# Patient Record
Sex: Female | Born: 2000 | Race: Black or African American
Health system: Southern US, Community
[De-identification: ages and names within clinical notes are randomized; demographics above are authoritative.]

## PROBLEM LIST (undated history)

## (undated) DIAGNOSIS — F429 Obsessive-compulsive disorder, unspecified: Secondary | ICD-10-CM

## (undated) DIAGNOSIS — F330 Major depressive disorder, recurrent, mild: Secondary | ICD-10-CM

## (undated) DIAGNOSIS — F99 Mental disorder, not otherwise specified: Secondary | ICD-10-CM

## (undated) DIAGNOSIS — R42 Dizziness and giddiness: Secondary | ICD-10-CM

## (undated) DIAGNOSIS — G43909 Migraine, unspecified, not intractable, without status migrainosus: Secondary | ICD-10-CM

## (undated) DIAGNOSIS — F411 Generalized anxiety disorder: Secondary | ICD-10-CM

## (undated) HISTORY — DX: Obsessive-compulsive disorder, unspecified: F42.9

## (undated) HISTORY — DX: Major depressive disorder, recurrent, mild: F33.0

## (undated) HISTORY — DX: Mental disorder, not otherwise specified: F99

## (undated) HISTORY — DX: Generalized anxiety disorder: F41.1

## (undated) HISTORY — DX: Migraine, unspecified, not intractable, without status migrainosus: G43.909

## (undated) HISTORY — DX: Dizziness and giddiness: R42

---
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
## 2019-06-08 DIAGNOSIS — G43909 Migraine, unspecified, not intractable, without status migrainosus: Secondary | ICD-10-CM | POA: Insufficient documentation

## 2021-01-15 LAB — HEPATITIS B SURFACE ANTIBODY: HEPATITIS B SURFACE ANTIBODY: <3.31 m[IU]/mL

## 2021-01-18 LAB — QUANTIFERON(R)-TB GOLD PLUS
Mitogen-NIL: >10 IU/mL
NIL: 0.01 IU/mL
Quantiferon TB Gold Plus: NEGATIVE
TB1-NIL: 0 IU/mL
TB2-NIL: 0 IU/mL

## 2021-02-25 ENCOUNTER — Encounter (INDEPENDENT_AMBULATORY_CARE_PROVIDER_SITE_OTHER): Payer: Self-pay

## 2021-02-25 NOTE — Telephone Encounter (Signed)
Practice Opening Telephone Encounter on Self

## 2021-03-28 ENCOUNTER — Encounter (INDEPENDENT_AMBULATORY_CARE_PROVIDER_SITE_OTHER): Payer: Self-pay | Admitting: Obstetrics and Gynecology

## 2021-03-28 ENCOUNTER — Encounter (HOSPITAL_BASED_OUTPATIENT_CLINIC_OR_DEPARTMENT_OTHER): Payer: Self-pay

## 2021-03-28 NOTE — Progress Notes (Addendum)
OP Behavioral Health Prescreen (Psychiatry and Therapy)      Patients Name (Always update Registration for Patient): Peggy Bravo        Do you require Hard of Hearing Services? no        Reason for Referral: Why are you seeking services: Pt reports "I have had recent loss and grief. I have an racing heart beat, feeling over whelmed and easily overwhelmed. I have an grandparent who has been diagnosed with cancer. I witness the loss of someone.\"  (Therapy services or Psychiatry services -Please elaborate on reason for referral, Referral source)     Please know that our goal is to serve the needs of the community the best we can. At this time in outpatient psychiatry, we provide medication management services until short-term treatment goals are met. We partner with primary care to transfer stable patients to a primary care provider for continuation of medication management when appropriate.  We will evaluate your needs with you and as you stabilize, we will determine next steps together.   We are not able to provide assessments or short-term and  long-term treatment for ADHD.    Would like to continue scheduling for psychiatry services? N/A        Are you seeking treatment for any substance use? no  If Yes, Explain:   (Note: Recommend CATS services or REFER OUT IF APPROPRIATE)        Are you currently experiencing Suicidal Ideation with a plan or intent? no   If Yes, explain:  Note: REFER OUT IF APPROPRIATE      Are you currently experiencing Homicidal Ideation with a plan or intent? no   If Yes, explain:  Note: REFER OUT IF APPROPRIATE        Additional Comment:        Recommended Level of Care: Therapy      Patient scheduled for: [employer]: No      IMG/Pricare Referral: No        Reminders:   Psychiatry - If requesting specific medications: Note that prescription of any medication is based on the outcome of the providers assessment    Please bring a list of all prescription  medication to include dosages to your scheduled appointment    We ask that you give [place] at least 24-hour notice if you need to cancel or reschedule your appointment.    If you feel the need for urgent care, before your scheduled appointment, please seek emergency care immediately.      NOTE:   If a patient requires Landscape architect; Document in appointment desk that language services is needed   If a Pt is requesting non-clinical documentation (i.e. Disability evaluation), please refer to the following information:  Our mental health services are therapeutic in nature and assessment is limited to clinical evaluation and treatment.  Assessments that do not fall under [facility]'s scope of services include:  Court-ordered compliance   Evidence of disability   Court mandated services resulting in the provider testifying on your behalf  Expert witnesses in court cases  Impressions for or fitness for custody   Evaluations for emotional support animals

## 2021-04-03 ENCOUNTER — Other Ambulatory Visit (INDEPENDENT_AMBULATORY_CARE_PROVIDER_SITE_OTHER): Payer: Self-pay

## 2021-04-04 ENCOUNTER — Encounter (INDEPENDENT_AMBULATORY_CARE_PROVIDER_SITE_OTHER): Payer: Self-pay

## 2021-04-04 ENCOUNTER — Telehealth (INDEPENDENT_AMBULATORY_CARE_PROVIDER_SITE_OTHER): Payer: BC Managed Care – PPO | Admitting: Professional

## 2021-04-04 DIAGNOSIS — Z8489 Family history of other specified conditions: Secondary | ICD-10-CM

## 2021-04-04 DIAGNOSIS — F4321 Adjustment disorder with depressed mood: Secondary | ICD-10-CM

## 2021-04-04 DIAGNOSIS — F411 Generalized anxiety disorder: Secondary | ICD-10-CM

## 2021-04-04 DIAGNOSIS — F330 Major depressive disorder, recurrent, mild: Secondary | ICD-10-CM

## 2021-04-04 DIAGNOSIS — F29 Unspecified psychosis not due to a substance or known physiological condition: Secondary | ICD-10-CM

## 2021-04-04 DIAGNOSIS — Z8659 Personal history of other mental and behavioral disorders: Secondary | ICD-10-CM
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
## 2021-04-18 ENCOUNTER — Telehealth (INDEPENDENT_AMBULATORY_CARE_PROVIDER_SITE_OTHER): Payer: BC Managed Care – PPO | Admitting: Professional

## 2021-04-18 DIAGNOSIS — F4321 Adjustment disorder with depressed mood: Secondary | ICD-10-CM

## 2021-04-18 DIAGNOSIS — F330 Major depressive disorder, recurrent, mild: Secondary | ICD-10-CM

## 2021-04-18 DIAGNOSIS — F411 Generalized anxiety disorder: Secondary | ICD-10-CM

## 2021-04-18 NOTE — Progress Notes (Unsigned)
[facility] Outpatient  Individual Therapy Note    Name:  Peggy Johnson   DOB: [birth year]  Medical Records: [id number]    Time in: 8:30 am   Time out:  9:15 am    Duration of Session:  45 minutes    Phone Call Visits Only: N/A    Goal(s) Addressed: Identification of cognitive distortions, Increased mindfulness, Positive behavioral changes Anxiety: eliminating all anxiety symptoms (SCL-90-R scores in normal range), reducing negative automatic thoughts, and reducing time spent worrying (<30 minutes/day)  Depression: eliminating all depressive symptoms (BDI score <10 for 1 month), increasing self-reward for positive behaviors (one/day), increasing self-reward for positive thoughts (one/day), and reducing negative automatic thoughts    Intervention: Cognitive-Behavioral Therapy strategies were implemented.  Interpersonal struggles in relationship/marriage were addressed with guidance.  Interpersonal, solution focused and support therapy strategies were implemented.     Verbal consent has been obtained from the patient to conduct video visit to minimize exposure to COVID-19. During COVID-19 pandemic, requirement for written consent has been waived.       NARRATIVE:     Patient's presenting problem: She continues to meet criteria for the [facility] DSM5 DIAGNOSES:32958} diagnoses. Patient stated, *** She continues to experience symptoms of: {.:21023014} and appears to be {Desc; clinical condition:2101701}.     Patient reported {he/she/they:37405} is experiencing *** and feelings of *** following recent ***. She noted {Desc; increased/decreased/absent/diminished:30183} psychosocial stressors including studying for the  MCAT.     Patient reported she does not take medications     ASSESSMENT:     Patient Health Questionnaire-9: PHQ-9 (Results):    Little interest or pleasure in doing things: (P) Not at all  Feeling down, depressed, or hopeless: (P) Several days  Trouble falling or staying asleep, or sleeping too  much: (P) Not at all  Feeling tired or having little energy: (P) Several days  Poor appetite or overeating: (P) Not at all  Feeling bad about yourself - or that you are a failure or have let yourself or your family down: (P) Not at all  Trouble concentrating on things, such as reading the newspaper or watching television: (P) More than half the days  Moving or speaking so slowly that other people could have noticed. Or the opposite - being so fidgety or restless that you have been moving around a lot more than usual: (P) More than half the days  Thoughts that you would be better off dead, or of hurting yourself in some way: (P) Not at all  PHQ Total Score: (P) 6    Current Symptoms:  Depressed mood    Feeling down, depressed, or hopeless     Feeling down due to physically not feeling well stressed   Mental Status Exam:    General Appearance: {BHGeneral/Appearance:46759::"neatly groomed","adequately nourished","casually dressed"}  Behavior/Psychomotor: normal  and good eye contact  Mood: depressed  and sad  Affect: congruent with mood and tearful  Speech: normal pitch and normal volume  Language: verbal expression is clear , has comprehensible ideas through verbal articulation, and ideas are conveyed and properly produced  Thought Form/Process: well organized, associations intact, and goal directed  Thought Content: absent of psychotic symptoms and absent of suicidal or homicidal ideation  Perception /Sensorium: clear, intact and makes sense of stimuli received  Judgment: fair  Insight: fair  Cognitive Functioning: alert, oriented to person, oriented to place, oriented to time, oriented to situation, memory intact, and attentive    Safety Risks:    Suicidal ideation? No  Homicidal ideation? No   Thoughts of harm to others? No   Thoughts of self injury? No   Substance abuse cravings? No     Risk factors include: {BHRisk Factors:46909}  Protective factors include: Identifies reasons for living Belief that suicide is  immoral; high spirituality, Responsibility to family or others; living with family, Supportive social network of family or friends, and Other: My dogs  Forward thinking:Yes     Patient was found to be at no significant risk for danger to self or danger to others and is safe for treatment in the outpatient level of care. Yes    Response to intervention/skills and/or knowledge gained: Rosalba shared about   Giref uring session and appeared {DESC; OPEN/CLOSED:18543} to feedback and     Journaling,writing, limited free time, exercise and spending time    intervention ***.        TREATMENT PLAN:     Patient Stated Goals Addressed: ***     [] Psychotherapy 1x weekly      [] Psychotherapy 2x monthly     {Psychotherapy Interventions:46315}    She is learning about the therapeutic interventions evidenced by:***.    Progress Towards Goals:    {MMPROGRESS:46183::"Stable/Improved; at goal"}      PLAN:     Follow up appointment: Date and Time:***  Patient aware to call 911 or ER in case of psychiatric emergency.  Continue psychotherapy with the patient and to emphasize the importance of implementing psychotherapeutic techniques at home and work.     Susann Givens, LPC

## 2021-04-29 ENCOUNTER — Encounter (INDEPENDENT_AMBULATORY_CARE_PROVIDER_SITE_OTHER): Payer: Self-pay | Admitting: Family

## 2021-04-29 ENCOUNTER — Ambulatory Visit (INDEPENDENT_AMBULATORY_CARE_PROVIDER_SITE_OTHER): Payer: BC Managed Care – PPO | Admitting: Family

## 2021-04-29 VITALS — BP 118/70 | HR 93 | Temp 97.0°F | Ht 64.0 in | Wt 215.8 lb

## 2021-04-29 DIAGNOSIS — Z13228 Encounter for screening for other metabolic disorders: Secondary | ICD-10-CM

## 2021-04-29 DIAGNOSIS — Z130 Encounter for screening for diseases of the blood and blood-forming organs and certain disorders involving the immune mechanism: Secondary | ICD-10-CM

## 2021-04-29 DIAGNOSIS — Z13220 Encounter for screening for lipoid disorders: Secondary | ICD-10-CM

## 2021-04-29 DIAGNOSIS — Z0000 Encounter for general adult medical examination without abnormal findings: Secondary | ICD-10-CM

## 2021-04-29 LAB — CBC AND DIFFERENTIAL
Absolute NRBC: 0 10*3/uL (ref 0.00–0.00)
Basophils Absolute Automated: 0.05 10*3/uL (ref 0.00–0.08)
Basophils Automated: 0.6 %
Eosinophils Absolute Automated: 0.22 10*3/uL (ref 0.00–0.44)
Eosinophils Automated: 2.7 %
Hematocrit: 37 % (ref 34.7–43.7)
Hgb: 11.3 g/dL — ABNORMAL LOW (ref 11.4–14.8)
Immature Granulocytes Absolute: 0.02 10*3/uL (ref 0.00–0.07)
Immature Granulocytes: 0.2 %
Lymphocytes Absolute Automated: 1.69 10*3/uL (ref 0.42–3.22)
Lymphocytes Automated: 21.1 %
MCH: 25.3 pg (ref 25.1–33.5)
MCHC: 30.5 g/dL — ABNORMAL LOW (ref 31.5–35.8)
MCV: 83 fL (ref 78.0–96.0)
MPV: 12.8 fL — ABNORMAL HIGH (ref 8.9–12.5)
Monocytes Absolute Automated: 0.56 10*3/uL (ref 0.21–0.85)
Monocytes: 7 %
Neutrophils Absolute: 5.48 10*3/uL (ref 1.10–6.33)
Neutrophils: 68.4 %
Nucleated RBC: 0 /100 WBC (ref 0.0–0.0)
Platelets: 301 10*3/uL (ref 142–346)
RBC: 4.46 10*6/uL (ref 3.90–5.10)
RDW: 14 % (ref 11–15)
WBC: 8.02 10*3/uL (ref 3.10–9.50)

## 2021-04-29 LAB — COMPREHENSIVE METABOLIC PANEL
ALT: 18 U/L (ref 0–55)
AST (SGOT): 17 U/L (ref 5–41)
Albumin/Globulin Ratio: 1.1 (ref 0.9–2.2)
Albumin: 3.6 g/dL (ref 3.5–5.0)
Alkaline Phosphatase: 73 U/L (ref 37–117)
Anion Gap: 9 (ref 5.0–15.0)
BUN: 8 mg/dL (ref 7.0–21.0)
Bilirubin, Total: 0.4 mg/dL (ref 0.2–1.2)
CO2: 24 mEq/L (ref 17–29)
Calcium: 9.5 mg/dL (ref 8.5–10.5)
Chloride: 103 mEq/L (ref 99–111)
Creatinine: 0.9 mg/dL (ref 0.4–1.0)
Globulin: 3.2 g/dL (ref 2.0–3.6)
Glucose: 79 mg/dL (ref 70–100)
Potassium: 4.7 mEq/L (ref 3.5–5.3)
Protein, Total: 6.8 g/dL (ref 6.0–8.3)
Sodium: 136 mEq/L (ref 135–145)

## 2021-04-29 LAB — LIPID PANEL
Cholesterol / HDL Ratio: 3.7 Index
Cholesterol: 183 mg/dL (ref 0–199)
HDL: 50 mg/dL (ref 40–9999)
LDL Calculated: 93 mg/dL (ref 0–99)
Triglycerides: 202 mg/dL — ABNORMAL HIGH (ref 34–149)
VLDL Calculated: 40 mg/dL (ref 10–40)

## 2021-04-29 LAB — HEMOLYSIS INDEX: Hemolysis Index: 4 Index (ref 0–24)

## 2021-04-29 LAB — GFR: EGFR: >60

## 2021-04-29 NOTE — Progress Notes (Signed)
Date: 04/29/2021 8:35 AM   Patient ID: Peggy Johnson is a 20 y.o. female.         Have you seen any specialists since your last visit with [place]?  Yes      The patient was informed that the following HM items are still COVID  Booster    Subjective:      Chief Complaint:  No chief complaint on file.      HPI:  Visit Type: Health Maintenance Visit  Work Status: working full-time  Reported Health: good health  Reported Diet: less fried foods  Reported Exercise: very active at work  Dental: dentist visit > 1 year ago  Vision: glasses and contact lenses  Hearing: normal hearing  Immunization Status: immunizations up to date  Reproductive Health: sexually active  Prior Screening Tests: no previous colorectal cancer screening and no previous pap smear  General Health Risks: no family history of prostate cancer, no family history of colon cancer, and no family history of breast cancer  Safety Elements Used: uses seat belts, smoke detectors in household, carbon monoxide detectors in household, and sunscreen use    Peggy Johnson is seen for fasting labs, CPE. 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 She has frequent migraines, which tend to improve with Excedrin Migraine. She has had a couple episodes of Vertigo, has not taken medication. Symptoms were self-limited.     Problem List:  There is no problem list on file for this patient.      Current Medications:  Outpatient Medications Marked as Taking for the 04/29/21 encounter (Office Visit) with Zetta Bills, FNP   Medication Sig Dispense Refill    aspirin-acetaminophen-caffeine (Excedrin Migraine) 250-250-65 MG per tablet       ibuprofen (ADVIL) 100 MG tablet       norgestimate-ethinyl estradiol triphasic 0.18/0.215/0.25 MG-25 MCG Tab oral tablet Take 1 tablet by mouth daily          Allergies:  Allergies   Allergen Reactions    Penicillins Hives and Itching       Past Medical History:  History reviewed. No pertinent past medical  history.    Past Surgical History:  History reviewed. No pertinent surgical history.    Family History:  History reviewed. No pertinent family history.    Social History:  Social History     Tobacco Use    Smoking status: Never    Smokeless tobacco: Never   Vaping Use    Vaping Use: Never used   Substance Use Topics    Alcohol use: Yes     Alcohol/week: 2.0 standard drinks     Types: 2 Standard drinks or equivalent per week    Drug use: Never        The following sections were reviewed this encounter by the provider:        Vitals:  BP 131/81 (BP Site: Left arm, Patient Position: Sitting, Cuff Size: Large)   Pulse 93   Temp 97 F (36.1 C)   Ht 1.626 m (5\' 4" )   Wt 97.9 kg (215 lb 12.8 oz)   LMP 04/11/2021   SpO2 98%   BMI 37.04 kg/m       Wt Readings from Last 3 Encounters:   04/29/21 97.9 kg (215 lb 12.8 oz)     ROS:   Review of Systems   General/Constitutional:   Denies Change in appetite. Denies Chills. Denies Fatigue. Denies Fever.   Ophthalmologic:  Denies Eye Pain.   ENT:   Denies Nasal Discharge. Denies Nosebleed.Denies Sore throat.   Respiratory:   Denies Cough. Denies Orthopnea. Denies Shortness of breath. Denies Wheezing.   Cardiovascular:   Denies Chest pain. Denies Chest pain with exertion. Denies Leg Claudication. Denies Palpitations. Denies Swelling in hands/feet.   Gastrointestinal:   Denies Abdominal pain. Denies Blood in stool. Denies Constipation. Denies Diarrhea. Denies Heartburn. Denies Nausea. Denies Vomiting.   Genitourinary:   Denies Blood in urine. Denies Nocturia. Denies Painful urination.   Musculoskeletal:   Denies Joint pain. Denies Joint stiffness. Denies Leg cramps. Denies Muscle aches. Denies Weakness in LE. Denies Swollen joints. Denies Weakness in UE.   Skin:   Denies Rash.   Neurologic:   Denies Balance difficulty. Denies Dizziness. Denies Gait abnormality. Denies Headache. Denies Pre-Syncope. Denies Memory loss. Denies Seizures. Denies Tingling/Numbness.   Psychiatric:    Denies Anxiety. Denies Depressed mood. Denies Difficulty sleeping.     Objective:     Examination:   Physical Exam   GENERAL APPEARANCE: alert, in no acute distress, well developed, well nourished, oriented to time, place, and person.   HEAD: normal appearance, atraumatic.   EYES: sclera anicteric, conjunctiva clear.   EARS: tympanic membranes normal bilaterally, external canals normal .   NOSE: normal nasal mucosa, no lesions.   ORAL CAVITY: normal oropharynx, normal lips, mucosa moist, no lesions.   THROAT: normal appearance, clear, no erythema.   NECK/THYROID: neck supple, no carotid bruit, carotid pulse 2+ bilaterally, no cervical lymphadenopathy, no neck mass palpated, no jugular venous distention, no thyromegaly.   LYMPH NODES: no palpable adenopathy.   SKIN: good turgor, no rashes, no suspicious lesions.   HEART: S1, S2 normal, no murmurs, rubs, gallops, regular rate and rhythm.   LUNGS: normal effort / no distress, normal breath sounds, clear to auscultation bilaterally, no wheezes, rales, rhonchi.   BREASTS: Not examined.  Deferred to GYN  ABDOMEN: bowel sounds present, no hepatosplenomegaly, soft, nontender, nondistended.   RECTAL: Not examined.  Deferred to GYN  FEMALE GENITOURINARY: Not examined.  Deferred to GYN  MUSCULOSKELETAL: full range of motion, no swelling or deformity.   EXTREMITIES: no clubbing, cyanosis, or edema.   PERIPHERAL PULSES: 2+ dorsalis pedis, 2+ posterior tibial.   NEUROLOGIC: deep tendon reflexes 2+ symmetrical, normal strength, tone and reflexes, sensory exam intact.   PSYCH: cognitive function intact, mood/affect full range, speech clear.        Assessment:     1. Routine medical exam    2. Screening for deficiency anemia  - CBC and differential    3. Screening for metabolic disorder  - Comprehensive metabolic panel    4. Screening, lipid  - Lipid panel        Plan:   Health Maintenance:   Recommend optimizing low carbohydrate diet efforts and obtaining at least 150 minutes of  aerobic exercise per week. Recommend 20-25 grams of dietary fiber daily. Recommend drinking at least 60-80 ounces of water per day. Recommend optimizing low sodium diet measures ( less than 2 grams of sodium in the diet per day ).Immunizations UTD. Vision screening UTD. Dental Screening UTD. Gynecology surveillance is due.  Recommend follow-up with gynecology at earliest convenience.    Discussed healthy low fat diet, regular aerobic exercise    Follow-up:   Return if symptoms worsen or fail to improve.       Zetta Bills, FNP

## 2021-05-01 ENCOUNTER — Telehealth (INDEPENDENT_AMBULATORY_CARE_PROVIDER_SITE_OTHER): Payer: BC Managed Care – PPO | Admitting: Professional

## 2021-05-10 ENCOUNTER — Encounter (INDEPENDENT_AMBULATORY_CARE_PROVIDER_SITE_OTHER): Payer: Self-pay | Admitting: Obstetrics and Gynecology

## 2021-05-10 ENCOUNTER — Ambulatory Visit (INDEPENDENT_AMBULATORY_CARE_PROVIDER_SITE_OTHER): Payer: BC Managed Care – PPO | Admitting: Obstetrics and Gynecology

## 2021-05-10 VITALS — BP 134/80 | HR 93 | Temp 97.2°F | Ht 64.0 in | Wt 216.0 lb

## 2021-05-10 DIAGNOSIS — Z01419 Encounter for gynecological examination (general) (routine) without abnormal findings: Secondary | ICD-10-CM

## 2021-05-10 DIAGNOSIS — Z113 Encounter for screening for infections with a predominantly sexual mode of transmission: Secondary | ICD-10-CM

## 2021-05-10 DIAGNOSIS — R42 Dizziness and giddiness: Secondary | ICD-10-CM | POA: Insufficient documentation

## 2021-05-10 DIAGNOSIS — Z760 Encounter for issue of repeat prescription: Secondary | ICD-10-CM

## 2021-05-10 LAB — HEPATITIS PANEL, ACUTE
Hep A IgM: NONREACTIVE
Hepatitis B Core IgM: NONREACTIVE
Hepatitis B Surface Antigen: NONREACTIVE
Hepatitis C, AB: NONREACTIVE

## 2021-05-10 LAB — SYPHILIS SCREEN IGG AND IGM: Syphilis Screen IgG and IgM: NONREACTIVE

## 2021-05-10 LAB — HIV-1/2 AG/AB 4TH GEN. W/ REFLEX: HIV Ag/Ab, 4th Generation: NONREACTIVE

## 2021-05-10 MED ORDER — NORGESTIM-ETH ESTRAD TRIPHASIC 0.18/0.215/0.25 MG-25 MCG PO TABS
1.0000 | ORAL_TABLET | Freq: Every day | ORAL | 3 refills | Status: DC
Start: 2021-05-10 — End: 2022-07-29

## 2021-05-10 NOTE — Progress Notes (Signed)
ANNUAL GYN VISIT     20 y.o. G0P0000 who presents for annual exam.  Anticipates initial pap smear with today's exam.    Chief Complaint   Patient presents with    Gynecologic Exam       She is doing well today without complaints.    Denies bladder/bowel/breast changes.    - Menses: monthly/OCP induced/ heavy flow; reports cramps, HA, nausea  - Sexual activity: yes:  more than one partner   using condoms regularly  - Contraception: Ortho tricyclen x 60yrs  - Paps:   abnormal history: no  Last pap: no prior pap smear   Procedures:   - STIs: denies  - Menopause/HRT: n/a  - HPV vaccine yes    ROS: Complete review of systems negative except as indicated above in HPI.    OB:  OB History   Gravida Para Term Preterm AB Living   0 0 0 0 0 0   SAB IAB Ectopic Multiple Live Births   0 0 0 0 0        PMH:  has a past medical history of GAD (generalized anxiety disorder), MDD (major depressive disorder), recurrent episode, Mental disorder, and OCD (obsessive compulsive disorder).   Surgical Hx:   has no past surgical history on file.  FH Hx: family history includes Cancer in her paternal grandfather; Diabetes in her paternal grandmother; Hypertension in her father; Kidney failure in her paternal grandmother; Stroke in her maternal grandmother and paternal grandfather.  Social:   reports that she has never smoked. She has never used smokeless tobacco. She reports current alcohol use of about 2.0 standard drinks per week. She reports that she does not use drugs.       Physical Exam   BP 134/80   Pulse 93   Temp 97.2 F (36.2 C) (Temporal)   Ht 5\' 4"  (1.626 m)   Wt 216 lb (98 kg)   LMP 04/11/2021   BMI 37.08 kg/m   GENERAL: NAD, pt is well-appearing and her stated age  PSYCHIATRIC: AAOx3, mood and affect appropriate   HEENT: normocephalic, atraumatic  CARDIAC: Normal pulses   PULMONARY: Normal respiratory effort  BREAST: Normal symmetry bilaterally. No skin dimpling, retraction or discoloration. No palpable masses or  adenopathy. No nipple discharge.  ABDOMEN: Soft, nontender, nondistended.  No appreciable masses.  EXTREMITY: no edema.  MUSCULOSKELETAL: normal range of motion  NEURO: no focal motor deficits   SKIN/INTEGUMENT: no rashes or bruising    PELVIC:   Normal external genitalia without any lesions or skin changes  Normal vaginal mucosa without evidence of atrophy; no discharge  Normal appearing cervix without lesions; no cervical motion tenderness  Uterus is anteverted, normal in size/contour, mobile and nontender  No adnexal fullness or tenderness      Assessment and Plan   20 y.o. G0P0000 who presents for annual exam.    1. Health Maintenance:  - Pap: initial pap smear to lab  - STI screening: Testing opted:  serum panel and genital probe to lab  - Mammogram: not indicated     2. Contraception: OCP continues- Ortho Tricyclen; refill Rx desired/sent via [employer]    Encouraged daily multivitamin with vitamin D supplementation.   Reviewed SBE instructions and encouraged monthly.   Healthy diet and exercise encouraged.     Lucendia Herrlich, MD  Date: 05/10/2021  Time: 11:47 AM

## 2021-05-11 LAB — HSV TYPE 1 AND 2 IGG
Herpes Antibody Type 1 IgG: NEGATIVE
Herpes Antibody Type 2 IgG: NEGATIVE

## 2021-05-13 LAB — GENITAL - CHLAMYDIA/NEISSERIA/TRICH PCR
Chlamydia DNA by PCR: NEGATIVE
Neisseria gonorrhoeae by PCR: NEGATIVE
Trichomonas vaginalis, DNA: NEGATIVE

## 2021-05-20 LAB — PAP SMEAR, THIN PREP WITH HR HPV: HPV DNA, high risk: NOT DETECTED

## 2021-07-18 ENCOUNTER — Telehealth (INDEPENDENT_AMBULATORY_CARE_PROVIDER_SITE_OTHER): Payer: Self-pay | Admitting: Adult Health

## 2021-07-18 NOTE — Telephone Encounter (Signed)
Patient requests immunization records. Printed and given to patient.

## 2021-08-21 ENCOUNTER — Encounter (INDEPENDENT_AMBULATORY_CARE_PROVIDER_SITE_OTHER): Payer: Self-pay | Admitting: Adult Health

## 2021-08-21 ENCOUNTER — Ambulatory Visit (INDEPENDENT_AMBULATORY_CARE_PROVIDER_SITE_OTHER): Payer: BC Managed Care – PPO | Admitting: Adult Health

## 2021-08-21 VITALS — BP 106/72 | HR 102 | Temp 97.7°F | Ht 64.0 in | Wt 219.2 lb

## 2021-08-21 DIAGNOSIS — Z3009 Encounter for other general counseling and advice on contraception: Secondary | ICD-10-CM
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
## 2021-09-19 ENCOUNTER — Other Ambulatory Visit (INDEPENDENT_AMBULATORY_CARE_PROVIDER_SITE_OTHER): Payer: Self-pay | Admitting: Adult Health

## 2021-09-19 MED ORDER — DIAZEPAM 5 MG PO TABS
5.0000 mg | ORAL_TABLET | Freq: Once | ORAL | 0 refills | Status: AC
Start: 2021-09-19 — End: 2021-09-19

## 2021-09-24 ENCOUNTER — Ambulatory Visit (INDEPENDENT_AMBULATORY_CARE_PROVIDER_SITE_OTHER): Payer: BC Managed Care – PPO | Admitting: Family

## 2021-09-24 ENCOUNTER — Encounter (INDEPENDENT_AMBULATORY_CARE_PROVIDER_SITE_OTHER): Payer: Self-pay | Admitting: Family

## 2021-09-24 VITALS — BP 128/88 | HR 96 | Temp 97.4°F | Resp 16 | Ht 64.0 in | Wt 219.0 lb

## 2021-09-24 DIAGNOSIS — J209 Acute bronchitis, unspecified: Secondary | ICD-10-CM

## 2021-09-24 DIAGNOSIS — R509 Fever, unspecified: Secondary | ICD-10-CM

## 2021-09-24 DIAGNOSIS — J029 Acute pharyngitis, unspecified: Secondary | ICD-10-CM

## 2021-09-24 LAB — IHS AMB POCT SOFIA (TM) COVID-19 & FLU A/B
Sofia Influenza A Ag POCT: NEGATIVE
Sofia Influenza B Ag POCT: NEGATIVE
Sofia SARS COV2 Antigen POCT: NEGATIVE

## 2021-09-24 LAB — POCT RAPID STREP A: Rapid Strep A Screen POCT: NEGATIVE

## 2021-09-24 MED ORDER — AZITHROMYCIN 250 MG PO TABS
ORAL_TABLET | ORAL | 0 refills | Status: AC
Start: 2021-09-24 — End: 2021-09-28

## 2021-09-24 NOTE — Progress Notes (Unsigned)
Fl Have you seen any specialists since your last visit with [place]?  No      The patient was informed that the following HM items are still outstanding:   Hpv vaccine, boosters.

## 2021-09-24 NOTE — Progress Notes (Unsigned)
Subjective:      Date: 09/24/2021 1:41 PM   Patient ID: Peggy Johnson is a 21 y.o. female.    Chief Complaint:  Chief Complaint   Patient presents with    Fever     X3 days    Generalized Body Aches    Sore Throat       HPI:  HPI    Problem List:  Patient Active Problem List   Diagnosis    Migraines    Vertigo       Current Medications:  Outpatient Medications Marked as Taking for the 09/24/21 encounter (Office Visit) with Zetta Bills, FNP   Medication Sig Dispense Refill    aspirin-acetaminophen-caffeine (Excedrin Migraine) 250-250-65 MG per tablet       Cholecalciferol (VITAMIN D3 PO)       Cyanocobalamin (VITAMIN B 12 PO)       ibuprofen (ADVIL) 100 MG tablet       norgestimate-ethinyl estradiol triphasic 0.18/0.215/0.25 MG-25 MCG Tab oral tablet Take 1 tablet by mouth daily 84 tablet 3    ST JOHNS WORT PO       UNABLE TO FIND Ashwagandha root           Allergies:  Allergies   Allergen Reactions    Penicillins Hives and Itching       Past Medical History:  Past Medical History:   Diagnosis Date    GAD (generalized anxiety disorder)     MDD (major depressive disorder), recurrent episode     Mental disorder     OCD (obsessive compulsive disorder)        Past Surgical History:  No past surgical history on file.    Family History:  Family History   Problem Relation Age of Onset    Cancer Paternal Grandfather     Stroke Paternal Grandfather     Kidney failure Paternal Grandmother     Diabetes Paternal Grandmother     Stroke Maternal Grandmother     Hypertension Father        Social History:  Social History     Tobacco Use    Smoking status: Never    Smokeless tobacco: Never   Vaping Use    Vaping status: Never Used   Substance Use Topics    Alcohol use: Yes     Alcohol/week: 2.0 standard drinks of alcohol     Types: 2 Glasses of wine per week    Drug use: Never          The following sections were reviewed this encounter by the provider:        Vitals:  BP 128/88 (BP Site: Right arm, Patient Position:  Sitting, Cuff Size: Large)   Pulse 96   Temp 97.4 F (36.3 C) (Temporal)   Resp 16   Ht 1.626 m (5\' 4" )   Wt 99.3 kg (219 lb)   LMP 09/05/2021 (Exact Date)   SpO2 98%   BMI 37.59 kg/m     ROS:  Review of Systems   General/Constitutional:   Denies Chills. Denies Fatigue. Denies Fever.   ENT:   Denies Nasal Discharge. Denies Sinus pain. Denies Sore throat.   Respiratory:   Admits Cough. Denies Shortness of breath. Denies Wheezing.   Cardiovascular:   Denies Chest pain. Denies Chest pain with exertion. Denies Palpitations.   Gastrointestinal:   Denies Nausea. Denies Vomiting.   Neurologic:   Denies Dizziness. Denies Headache.        Objective:  Physical Exam:  Physical Exam   General Examination:   GENERAL APPEARANCE: alert, in no acute distress, well developed, well nourished  EYES: Conjunctiva clear B/L  EARS:  TM intact B/L no erythema EAC's intact B/L  SINUS: No sinus tenderness  ORAL CAVITY: normal oropharynx, normal lips, mucosa moist.   THROAT: clear PND   NECK/THYROID: neck supple,  no cervical  lymphadenopathy, no neck mass palpated  HEART: S1, S2 normal, no murmurs, rubs, gallops, regular rate and rhythm.   LUNGS: normal effort / no distress, normal breath sounds, CTA B/L, no wheezes, no rales, no rhonchi  NEUROLOGIC: alert and oriented.      Assessment:       1. Fever and chills  - Sofia(TM) SARS COVID19 & Flu A/B POCT  - COVID-19 (SARS-CoV-2); Future    2. Sorethroat  - POCT Rapid Group A Strep        Plan:   URI:  Status: {uristatus:36940}  Treatment recommendations: {URIrecommendations:36941}{URItxabx:36942}    No follow-ups on file.    Zetta Bills, FNP

## 2021-09-25 LAB — COVID-19 (SARS-COV-2): SARS CoV 2 Overall Result: NOT DETECTED

## 2021-10-02 ENCOUNTER — Encounter (INDEPENDENT_AMBULATORY_CARE_PROVIDER_SITE_OTHER): Payer: Self-pay | Admitting: Adult Health

## 2021-10-02 ENCOUNTER — Ambulatory Visit (INDEPENDENT_AMBULATORY_CARE_PROVIDER_SITE_OTHER): Payer: BC Managed Care – PPO | Admitting: Adult Health

## 2021-10-02 VITALS — BP 104/72 | HR 83 | Temp 97.8°F | Resp 16 | Ht 64.8 in | Wt 218.0 lb

## 2021-10-02 DIAGNOSIS — Z3202 Encounter for pregnancy test, result negative: Secondary | ICD-10-CM

## 2021-10-02 DIAGNOSIS — Z30430 Encounter for insertion of intrauterine contraceptive device: Secondary | ICD-10-CM

## 2021-10-02 LAB — POCT PREGNANCY TEST, URINE HCG: POCT Pregnancy HCG Test, UR: NEGATIVE

## 2021-10-02 MED ORDER — LEVONORGESTREL 20 MCG/DAY IU IUD
1.0000 | INTRAUTERINE_SYSTEM | Freq: Once | INTRAUTERINE | Status: AC
Start: 2021-10-02 — End: 2021-10-02
  Administered 2021-10-02: 52 mg via INTRAUTERINE

## 2021-10-02 NOTE — Progress Notes (Unsigned)
UNIVERSAL PROTOCOL:  TIMEOUT    Procedure: Mirena IUD Placement Date: 10/02/2021   All correct equipment/supplies are present  And ready for use prior to the procedure YES   Patient stated name and date of birth YES   Patient verbally stated the procedure (including the site and side) to be completed. YES   Informed Consent reviewed and signed consistent with procedure, side, and site information YES   Practitioner (MD/DO/DPM/NP/PA/RN) performing the procedure marked the site as indicated YES   Asked the patient for any known drug allergies, including anesthetics and latex. YES   Labels for specimens are prepared with the following information:  Date specimen collected  Name of patient  MR#  Provider name  Specimen type  and placed on containers in presence of patient N/A   Verified that patient has had all questions answered. YES   Method of notification of biopsy results requested by patient      UPT: Negative N/A           INSTRUMENTS USED DURING PROCEDURE:     Tenaculum sterilized on 09/26/2021 in load 1    scissors sterilized on 09/26/2021 in load 2    0 sterilized on 0 in load 0

## 2021-10-02 NOTE — Progress Notes (Unsigned)
Procedures      Peggy Johnson 21 y.o. G0P0000 presents for Mirena IUD insertion       Procedure: Mirena IUD insertion  Date: 10/02/2021   LMP: Patient's last menstrual period was 09/29/2021 (exact date).  Contraception: OCP  UPT: negative    BP 104/72 (BP Site: Left arm, Patient Position: Sitting, Cuff Size: Large)   Pulse 83   Temp 97.8 F (36.6 C) (Temporal)   Resp 16   Ht 5' 4.8" (1.646 m)   Wt 218 lb (98.9 kg)   LMP 09/29/2021 (Exact Date)   SpO2 99%   BMI 36.50 kg/m      Prior to the procedure, the patient provided informed written consent via iMED. A bimanual examination was performed and revealed an anteverted uterus. A speculum was inserted into the vagina and the cervix was visualized. The cervix was cleansed with betadine solution. A single tooth tenaculum was placed on the anterior lip of the cervix. The uterus was sounded to 7.5 cm. The Mirena IUD insertion device was inserted through the os to the fundus and the IUD was deployed per the manufacturer's instructions without difficulty. The IUD strings were trimmed to 3cm. The single tooth tenaculum was removed and the sites determined to be hemostatic to gentle pressure. Monsel's solution was utilized at each tenaculum site to ensure hemostasis. The speculum was removed from the vagina. The patient tolerated the procedure well.     - Advised continue to use OCP for back up and to help with bleeding for 4 weeks or switch to condoms.   - Follow-up in 6 wks for string check. Sooner for issues.  - Reviewed bleeding precautions. Advised ibuprofen 600mg  for pain     Azadeh Hyder A Valeri Sula, NP

## 2021-10-04 ENCOUNTER — Ambulatory Visit (INDEPENDENT_AMBULATORY_CARE_PROVIDER_SITE_OTHER): Payer: BC Managed Care – PPO | Admitting: Adult Health

## 2021-10-16 ENCOUNTER — Ambulatory Visit
Admission: RE | Admit: 2021-10-16 | Discharge: 2021-10-16 | Disposition: A | Discharge status: Home / Self Care | Payer: BC Managed Care – PPO | Source: Ambulatory Visit | Attending: Family | Admitting: Family

## 2021-10-16 ENCOUNTER — Encounter (INDEPENDENT_AMBULATORY_CARE_PROVIDER_SITE_OTHER): Payer: Self-pay | Admitting: Family

## 2021-10-16 ENCOUNTER — Ambulatory Visit (INDEPENDENT_AMBULATORY_CARE_PROVIDER_SITE_OTHER): Payer: BC Managed Care – PPO | Admitting: Family

## 2021-10-16 VITALS — BP 137/78 | HR 88 | Temp 98.1°F | Resp 18 | Ht 64.0 in | Wt 219.0 lb

## 2021-10-16 DIAGNOSIS — S99921A Unspecified injury of right foot, initial encounter: Secondary | ICD-10-CM | POA: Insufficient documentation

## 2021-10-16 MED ORDER — NAPROXEN 500 MG PO TABS
500.0000 mg | ORAL_TABLET | Freq: Two times a day (BID) | ORAL | 0 refills | Status: AC | PRN
Start: 2021-10-16 — End: 2021-11-05

## 2021-10-16 NOTE — Progress Notes (Signed)
[facility]                       Date of Exam: 10/16/2021 7:53 PM        Patient ID: Peggy Johnson is a 21 y.o. Johnson.  Attending Physician: Amadeo GarnetIfeoma Elizabeth Emmalise Huard, Peggy Johnson        Chief Complaint:    Chief Complaint   Patient presents with    Foot Swelling     Pt dropped 45lb weight on r-foot on 5/17  Pt reports 6/10               HPI:    Patient patient is a 21 year old Johnson who presents today with complaints of right foot injury.  She reports incident occurred yesterday at the gym.  She had dropped a 45 pound weights on the dorsal aspect of right foot.  Foot appears swollen and tender to touch  Patient has been applying ice to the area  Reports mild difficulty with walking.          Problem List:    Patient Active Problem List   Diagnosis    Migraines    Vertigo             Current Meds:    Outpatient Medications Marked as Taking for the 10/16/21 encounter (Office Visit) with Amadeo GarnetIlodianya, Arlo Buffone Elizabeth, Peggy Johnson   Medication Sig Dispense Refill    aspirin-acetaminophen-caffeine (Excedrin Migraine) 250-250-65 MG per tablet       Cholecalciferol (VITAMIN D3 PO)       Cyanocobalamin (VITAMIN B 12 PO)       norgestimate-ethinyl estradiol triphasic 0.18/0.215/0.25 MG-25 MCG Tab oral tablet Take 1 tablet by mouth daily 84 tablet 3    ST JOHNS WORT PO       UNABLE TO FIND Ashwagandha root      [DISCONTINUED] ibuprofen (ADVIL) 100 MG tablet             Allergies:    Allergies   Allergen Reactions    Penicillins Hives and Itching             Past Surgical History:    History reviewed. No pertinent surgical history.        Family History:    Family History   Problem Relation Age of Onset    Cancer Paternal Grandfather     Stroke Paternal Grandfather     Kidney failure Paternal Grandmother     Diabetes Paternal Grandmother     Stroke Maternal Grandmother     Hypertension Father     Miscarriages / [place] Mother            Social History:    Social History     Tobacco Use     Smoking status: Never    Smokeless tobacco: Never   Vaping Use    Vaping status: Never Used   Substance Use Topics    Alcohol use: Yes     Alcohol/week: 2.0 standard drinks of alcohol     Types: 2 Glasses of wine per week    Drug use: Never           The following sections were reviewed this encounter by the provider:            Vital Signs:    BP 137/78 (BP Site: Left arm, Patient Position: Sitting, Cuff Size: Medium)   Pulse 88   Temp 98.1 F (36.7 C) (Temporal)  Resp 18   Ht 1.626 m (5\' 4" )   Wt 99.3 kg (219 lb)   LMP 09/29/2021 (Exact Date)   SpO2 100%   BMI 37.59 kg/m          ROS:    Review of Systems   Constitutional: Negative.  Negative for chills, fatigue and fever.   HENT: Negative.  Negative for congestion and rhinorrhea.    Respiratory: Negative.  Negative for chest tightness and shortness of breath.    Cardiovascular: Negative.    Gastrointestinal: Negative.  Negative for abdominal pain.   Genitourinary: Negative.  Negative for difficulty urinating, dysuria and flank pain.   Musculoskeletal:  Positive for arthralgias and joint swelling (right foot).   Neurological: Negative.  Negative for numbness.   Psychiatric/Behavioral: Negative.                Physical Exam:    Physical Exam  Vitals reviewed.   Constitutional:       General: She is not in acute distress.     Appearance: Normal appearance.   HENT:      Head: Normocephalic and atraumatic.   Cardiovascular:      Rate and Rhythm: Normal rate and regular rhythm.   Pulmonary:      Effort: Pulmonary effort is normal.      Breath sounds: Normal breath sounds.   Musculoskeletal:      Right foot: Decreased range of motion.   Feet:      Right foot:      Skin integrity: No skin breakdown.      Comments: Notable Swelling  Neurological:      Mental Status: She is alert and oriented to person, place, and time.   Psychiatric:         Mood and Affect: Mood normal.                Assessment/Plan:    1. Foot injury, right, initial encounter  - XR Foot  Right AP And Lateral; Future  - naproxen (NAPROSYN) 500 MG tablet; Take 1 tablet (500 mg) by mouth 2 (two) times daily as needed (With meals)  Dispense: 20 tablet; Refill: 0  - Orthopaedic Surgery Referral [Foot/Ankle]: 10/01/2021, MD ([facility] [place]); Future    Notable swelling and tenderness noted to dorsal aspect of right foot  We will obtain an x-ray rule out fracture  Naproxen ordered for pain as needed  Recommend continuing RICE therapy  Referral to orthopedic given            Follow-up:    Return if symptoms worsen or fail to improve.         Lorna Dibble, Peggy Johnson

## 2021-10-16 NOTE — Progress Notes (Signed)
Have you seen any specialists since your last visit with [place]?  Yes  OBGYN    The patient was informed that the following HM items are still outstanding:   HPV, COVID-19

## 2022-02-18 ENCOUNTER — Other Ambulatory Visit (INDEPENDENT_AMBULATORY_CARE_PROVIDER_SITE_OTHER): Payer: Self-pay | Admitting: Family

## 2022-04-07 HISTORY — PX: WISDOM TOOTH EXTRACTION: SHX21

## 2022-04-23 ENCOUNTER — Ambulatory Visit (INDEPENDENT_AMBULATORY_CARE_PROVIDER_SITE_OTHER): Payer: BC Managed Care – PPO

## 2022-04-23 DIAGNOSIS — Z23 Encounter for immunization: Secondary | ICD-10-CM

## 2022-04-23 NOTE — Progress Notes (Signed)
Patient presented to the office for flu administration.  Received injection in the Right arm.  No reaction was noted and patient left in good condition.

## 2022-07-29 ENCOUNTER — Ambulatory Visit (INDEPENDENT_AMBULATORY_CARE_PROVIDER_SITE_OTHER): Payer: BC Managed Care – PPO | Admitting: Adult Health

## 2022-07-29 ENCOUNTER — Encounter (INDEPENDENT_AMBULATORY_CARE_PROVIDER_SITE_OTHER): Payer: Self-pay | Admitting: Adult Health

## 2022-07-29 VITALS — BP 131/70 | HR 80 | Temp 97.9°F | Resp 18 | Wt 225.0 lb

## 2022-07-29 DIAGNOSIS — Z01419 Encounter for gynecological examination (general) (routine) without abnormal findings: Secondary | ICD-10-CM

## 2022-07-29 DIAGNOSIS — M549 Dorsalgia, unspecified: Secondary | ICD-10-CM

## 2022-07-29 DIAGNOSIS — G8929 Other chronic pain: Secondary | ICD-10-CM

## 2022-07-29 DIAGNOSIS — Z30431 Encounter for routine checking of intrauterine contraceptive device: Secondary | ICD-10-CM

## 2022-07-29 NOTE — Progress Notes (Signed)
Peggy Johnson is a 22 y.o. year old female, G0P0000 established patient, and she is here for a well woman exam.   Patient's last menstrual period was 07/21/2022 (exact date)..  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     OB History   Gravida Para Term Preterm AB Living   0 0 0 0 0 0   SAB IAB Ectopic Multiple Live Births   0 0 0 0 0     Past Medical History:   Diagnosis Date    GAD (generalized anxiety disorder)     MDD (major depressive disorder), recurrent episode     Mental disorder     Migraine     OCD (obsessive compulsive disorder)     Vertigo      Past Surgical History:   Procedure Laterality Date    WISDOM TOOTH EXTRACTION Bilateral 04/07/2022     Family History   Problem Relation Age of Onset    Cancer Paternal Grandfather     Stroke Paternal Grandfather     Kidney failure Paternal Grandmother     Diabetes Paternal Grandmother     Stroke Maternal Grandmother     Hypertension Father     Miscarriages / [place] Mother      Social History     Socioeconomic History    Marital status: Single   Tobacco Use    Smoking status: Never    Smokeless tobacco: Never   Vaping Use    Vaping Use: Never used   Substance and Sexual Activity    Alcohol use: Yes     Alcohol/week: 2.0 standard drinks of alcohol     Types: 2 Glasses of wine per week    Drug use: Never    Sexual activity: Yes     Partners: Male     Birth control/protection: Condom, OCP     Social Determinants of Health     Financial Resource Strain: Low Risk  (07/26/2022)    Overall Financial Resource Strain (CARDIA)     Difficulty of Paying Living Expenses: Not hard at all    Food Insecurity: No Food Insecurity (07/26/2022)    Hunger Vital Sign     Worried About Running Out of Food in the Last Year: Never true     Ran Out of Food in the Last Year: Never true   Transportation Needs: No Transportation Needs (07/26/2022)    PRAPARE - Armed forces logistics/support/administrative officer (Medical): No     Lack of Transportation (Non-Medical): No   Physical Activity: Sufficiently Active (07/26/2022)    Exercise Vital Sign     Days of Exercise per Week: 6 days     Minutes of Exercise per Session: 80 min   Stress: Stress Concern Present (07/26/2022)    [place]     Feeling of Stress : To some extent   Social Connections: Moderately Integrated (07/26/2022)    Social  Connection and Isolation Panel [NHANES]     Frequency of Communication with Friends and Family: More than three times a week     Frequency of Social Gatherings with Friends and Family: More than three times a week     Attends Religious Services: 1 to 4 times per year     Active Member of [employer] or Organizations: Yes     Attends Archivist Meetings: 1 to 4 times per year     Marital Status: Never married   Intimate Partner Violence: Not At Risk (07/26/2022)    Humiliation, Afraid, Rape, and Kick questionnaire     Fear of Current or Ex-Partner: No     Emotionally Abused: No     Physically Abused: No     Sexually Abused: No   Housing Stability: [place]  (07/26/2022)    Housing Stability Vital Sign     Unable to Pay for Housing in the Last Year: No     Number of [place] in the Last Year: 1     Unstable Housing in the Last Year: No     Current Outpatient Medications on File Prior to Visit   Medication Sig Dispense Refill    aspirin-acetaminophen-caffeine (Excedrin Migraine) 250-250-65 MG per tablet       Cholecalciferol (VITAMIN D3 PO)       Cyanocobalamin (VITAMIN B 12 PO)       ST JOHNS WORT PO       [DISCONTINUED] norgestimate-ethinyl estradiol triphasic 0.18/0.215/0.25  MG-25 MCG Tab oral tablet Take 1 tablet by mouth daily 84 tablet 3    [DISCONTINUED] UNABLE TO FIND Ashwagandha root       No current facility-administered medications on file prior to visit.     Allergies   Allergen Reactions    Penicillins Hives and Itching         Review of Systems     Constitutional: Negative for fever and chills.   Respiratory: Negative for shortness of breath.    Cardiovascular: Negative for chest pain.   Gastrointestinal: Negative for nausea, vomiting and abdominal pain.   Genitourinary: Negative for dysuria and pelvic pain.   All other systems reviewed and are negative.    OBJECTIVE:    Vitals: Blood pressure 131/70, pulse 80, temperature 97.9 F (36.6 C), temperature source Oral, resp. rate 18, weight 225 lb (102.1 kg), last menstrual period 07/21/2022.Body mass index is 38.62 kg/m.      Exams:     GENERAL: well developed, well nourished, in no apparent distress,  NECK: Neck is supple with full range of motion; thyroid is normal to palpation;   RESPIRATORY: normal respiratory rate and pattern with no distress;   BREASTS: symmetric; no overlying skin changes;  no tenderness, nodularity, palpable nodes,or masses;   GASTROINTESTINAL: no masses palpated; nontender;   Pelvic exam:  VULVA: normal appearing vulva with no masses, tenderness or lesions, normal clitoris  BLADDER: non tender bladder and urethra. No masses noted. Ureteral meatus appears normal without masses or tenderness  VAGINA: normal appearing vagina with normal color and discharge, no lesions  CERVIX: normal appearing cervix without discharge or lesions. IUD strings seen   UTERUS: uterus is normal size, shape, consistency and nontender, anteverted  ADNEXA:  nontender and no masses.  MUSCULOSKELETAL: normal gait;   NEUROLOGIC: Alert and oriented   PSYCHIATRIC: appropriate affect and demeanor;       ASSESSMENT:   1. Well woman exam with routine gynecological exam  2. Other chronic back pain        3. Encounter for routine  checking of intrauterine contraceptive device (IUD)            Routine well woman gynecological examination:  Normal exam.     PLAN:     Pap UTD, next due in 1 years per ASCCP guidelines. Pap guidelines discussed with patient.   Not SA, STD declined   RECOMMENDATIONS given include: taught and demonstrated SBE. Reviewed nutrition and exercise.  Back pain caused by breasts- considering a reduction but will likely wait until after med school  IUD appears in normal position- expires 2031  RTO in one year or as needed    Pleasant Britz A Roseanne Juenger, NP       Results through [employer].

## 2023-01-26 ENCOUNTER — Ambulatory Visit (INDEPENDENT_AMBULATORY_CARE_PROVIDER_SITE_OTHER): Payer: BC Managed Care – PPO | Admitting: Family

## 2023-01-26 ENCOUNTER — Encounter (INDEPENDENT_AMBULATORY_CARE_PROVIDER_SITE_OTHER): Payer: Self-pay | Admitting: Family

## 2023-01-26 VITALS — BP 138/79 | HR 121 | Temp 98.1°F | Resp 18 | Ht 64.0 in | Wt 234.0 lb

## 2023-01-26 DIAGNOSIS — R509 Fever, unspecified: Secondary | ICD-10-CM

## 2023-01-26 DIAGNOSIS — J029 Acute pharyngitis, unspecified: Secondary | ICD-10-CM

## 2023-01-26 DIAGNOSIS — U071 COVID-19: Secondary | ICD-10-CM

## 2023-01-26 LAB — IHS AMB POCT SOFIA (TM) COVID-19 & FLU A/B
Sofia Influenza A Ag POCT: NEGATIVE
Sofia Influenza B Ag POCT: NEGATIVE
Sofia SARS COV2 Antigen POCT: POSITIVE — AB

## 2023-01-26 LAB — POCT RAPID STREP A: Rapid Strep A Screen POCT: NEGATIVE

## 2023-01-26 MED ORDER — NIRMATRELVIR&RITONAVIR 300/100 20 X 150 MG & 10 X 100MG PO TBPK
3.0000 | ORAL_TABLET | Freq: Two times a day (BID) | ORAL | 0 refills | Status: AC
Start: 2023-01-26 — End: 2023-01-31

## 2023-01-26 NOTE — Progress Notes (Signed)
Have you seen any specialists since your last visit with [place]?  No      The patient was informed that the following HM items are still outstanding:   influenza vaccinel, covids, hpv.

## 2023-01-26 NOTE — Progress Notes (Signed)
Subjective:      Date: 01/26/2023 10:12 AM   Patient ID: Peggy Johnson is a 22 y.o. female.    Chief Complaint:  Chief Complaint   Patient presents with    Fever     X 2 days.     Sore Throat    Nasal Congestion    Cough    Nausea       HPI:  Peggy Johnson is seen for evaluation of sore throat, fever/chills, congestion, productive cough for 2 days. Denies significant dyspnea.     Fever   Associated symptoms include coughing.   Sore Throat   Associated symptoms include coughing.   Cough  Associated symptoms include a fever.       Problem List:  Problem List[1]    Current Medications:  Medications Taking[2]      Allergies:  Allergies[3]    Past Medical History:  Medical History[4]    Past Surgical History:  Past Surgical History:   Procedure Laterality Date    WISDOM TOOTH EXTRACTION Bilateral 04/07/2022       Family History:  Family History   Problem Relation Age of Onset    Cancer Paternal Grandfather     Stroke Paternal Grandfather     Kidney failure Paternal Grandmother     Diabetes Paternal Grandmother     Stroke Maternal Grandmother     Hypertension Father     Miscarriages / [place] Mother        Social History:  Social History[5]       The following sections were reviewed this encounter by the provider:        Vitals:  BP 138/79 (BP Site: Left arm, Patient Position: Sitting, Cuff Size: X-Large)   Pulse (!) 121   Temp 98.1 F (36.7 C) (Oral)   Resp 18   Ht 1.626 m (5\' 4" )   Wt 106.1 kg (234 lb)   LMP 12/25/2022 (Exact Date)   SpO2 98%   BMI 40.17 kg/m     ROS:  Review of Systems   Constitutional:  Positive for fever.   Respiratory:  Positive for cough.              Objective:       Physical Exam:  Physical Exam   General Examination:   GENERAL APPEARANCE: alert, in no acute distress, well developed, well nourished  HEART: S1, S2 normal, no murmurs, rubs, gallops, regular rate and rhythm.   LUNGS: normal effort / no distress, scattered rhonchi cleared with cough  NEUROLOGIC: alert and oriented.       Assessment:       1. COVID-19  - nirmatrelvir-ritonavir (PAXLOVID) 20 x 150 MG & 10 x 100MG  dose pack(emergency use authorization); Take 3 tablets by mouth 2 (two) times daily for 5 days The dosage for PAXLOVID is 300 mg nirmatrelvir (two 150 mg tablets) with 100 mg ritonavir (one 100 mg tablet) with all three tablets taken together.  Dispense: 30 tablet; Refill: 0    2. Fever, unspecified fever cause  - Sofia(TM) SARS COVID19 & Flu A/B POCT    3. Sore throat  - POCT Rapid Group A Strep        Plan:   COVID:  Status:  Recent home COVID positive test - symptomatic.  Due to high risk of complication from COVID infection related to BMI, Paxlovid therapy is recommended.  Side effects discussed with pt, including altered sense of taste or smell, diarrhea, or muscle aches.  Drug-drug interactions  reviewed.   Recommendations:  Home infection control precautions reviewed with patient.  Counseled pt on COVID risk factors, symptoms, complications, and management.  Recommend OTC Mucinex BID, Acetaminophen/Ibuprofen PRN fever, and optimize hydration.  - Self-isolate for at least 5 full days and, if you no longer have symptoms at that time, you may leave isolation and continue to wear a high quality mask for 5 days to minimize the risk of infecting others (the majority of COVID transmission occurs early in the course of illness, generally in the 1-2 days prior to the onset of symptoms and the 2-3 days after).  If you are lucky enough to have access to a rapid antigen test, then you can test at day 5 and 7 - if negative, you can end wearing a mask.  - Pt instructed to seek immediate medical attention for worsening cough, chest pain, shortness of breath, or change in mental status.    Return if symptoms worsen or fail to improve.    Zetta Bills, FNP            [1]   Patient Active Problem List  Diagnosis    Migraines    Vertigo   [2]   Outpatient Medications Marked as Taking for the 01/26/23 encounter (Office Visit) with  Zetta Bills, FNP   Medication Sig Dispense Refill    aspirin-acetaminophen-caffeine (Excedrin Migraine) 250-250-65 MG per tablet       Cholecalciferol (VITAMIN D3 PO)       Cyanocobalamin (VITAMIN B 12 PO)       ST JOHNS WORT PO      [3]   Allergies  Allergen Reactions    Penicillins Hives and Itching   [4]   Past Medical History:  Diagnosis Date    GAD (generalized anxiety disorder)     MDD (major depressive disorder), recurrent episode     Mental disorder     Migraine     OCD (obsessive compulsive disorder)     Vertigo    [5]   Social History  Tobacco Use    Smoking status: Never    Smokeless tobacco: Never   Vaping Use    Vaping status: Never Used   Substance Use Topics    Alcohol use: Yes     Alcohol/week: 2.0 standard drinks of alcohol     Types: 2 Glasses of wine per week    Drug use: Never

## 2023-05-13 ENCOUNTER — Encounter (INDEPENDENT_AMBULATORY_CARE_PROVIDER_SITE_OTHER): Payer: Self-pay | Admitting: Family

## 2023-05-13 ENCOUNTER — Ambulatory Visit (INDEPENDENT_AMBULATORY_CARE_PROVIDER_SITE_OTHER): Payer: BC Managed Care – PPO | Admitting: Family

## 2023-05-13 VITALS — BP 122/88 | HR 93 | Temp 97.8°F | Resp 16 | Ht 64.0 in | Wt 235.0 lb

## 2023-05-13 DIAGNOSIS — R059 Cough, unspecified: Secondary | ICD-10-CM

## 2023-05-13 DIAGNOSIS — J029 Acute pharyngitis, unspecified: Secondary | ICD-10-CM

## 2023-05-13 LAB — POCT RAPID STREP A: Rapid Strep A Screen POCT: NEGATIVE

## 2023-05-13 LAB — IHS AMB POCT SOFIA (TM) COVID-19 & FLU A/B
Sofia Influenza A Ag POCT: NEGATIVE
Sofia Influenza B Ag POCT: NEGATIVE
Sofia SARS COV2 Antigen POCT: NEGATIVE

## 2023-05-13 MED ORDER — AZITHROMYCIN 250 MG PO TABS
ORAL_TABLET | ORAL | 0 refills | Status: AC
Start: 2023-05-13 — End: 2023-05-17

## 2023-05-13 NOTE — Progress Notes (Signed)
Subjective:      Date: 05/13/2023 3:15 PM   Patient ID: Peggy Johnson is a 22 y.o. female.    Chief Complaint:  Chief Complaint   Patient presents with    Cough     X 2 days     Sore Throat    Chills       HPI:  Peggy Johnson is seen for evaluation of sore throat, cough and congestion. She had chills, unknown fever; no significant dyspnea. Throat pain is worsening.         Problem List:  Problem List[1]    Current Medications:  Medications Taking[2]      Allergies:  Allergies[3]    Past Medical History:  Medical History[4]    Past Surgical History:  Past Surgical History[5]    Family History:  Family History[6]    Social History:  Social History[7]       The following sections were reviewed this encounter by the provider:        Vitals:  BP 122/88 (BP Site: Left arm, Patient Position: Sitting, Cuff Size: X-Large)   Pulse 93   Temp 97.8 F (36.6 C) (Oral)   Resp 16   Ht 1.626 m (5\' 4" )   Wt 106.6 kg (235 lb)   LMP 04/16/2023 (Approximate)   SpO2 98%   BMI 40.34 kg/m     ROS:  Review of Systems   Constitutional:  Positive for chills and fatigue.   HENT:  Positive for congestion and sore throat.    Respiratory:  Positive for cough. Negative for shortness of breath.    Neurological:  Positive for headaches.             Objective:       Physical Exam:  Physical Exam  Constitutional:       Appearance: She is ill-appearing.   HENT:      Head: Normocephalic and atraumatic.      Right Ear: Tympanic membrane, ear canal and external ear normal.      Left Ear: Tympanic membrane, ear canal and external ear normal.      Nose: Congestion and rhinorrhea present.      Mouth/Throat:      Pharynx: Posterior oropharyngeal erythema present. No oropharyngeal exudate.   Cardiovascular:      Rate and Rhythm: Normal rate and regular rhythm.      Pulses: Normal pulses.      Heart sounds: Normal heart sounds.   Pulmonary:      Effort: Pulmonary effort is normal.      Breath sounds: Normal breath sounds.   Lymphadenopathy:       Cervical: Cervical adenopathy present.   Neurological:      Mental Status: She is alert.             Assessment:       1. Acute pharyngitis, unspecified etiology  - azithromycin (ZITHROMAX) 250 MG tablet; Take two 250mg  tabs PO day one then one 250mg  tab daily x 4 days  Dispense: 6 tablet; Refill: 0    2. Sorethroat  - POCT Rapid Group A Strep  - Culture, Throat; Future  - SARS-CoV-2 (COVID-19) RNA, PCR; Future  - Culture, Throat  - SARS-CoV-2 (COVID-19) RNA, PCR    3. Cough in adult  - Sofia(TM) SARS COVID19 & Flu A/B POCT        Plan:   Can hold abx if feeling better in the next 24 hours; please start if culture positive for strep  Status: worsening  Treatment recommendations: optimize H20 hydration, nasal saline rinses twice a day, salt water gargles 2-3 times a day, and Vitamin C 500mg  twice a day (oxidative support)    Return if symptoms worsen or fail to improve.    Zetta Bills, FNP            [1]   Patient Active Problem List  Diagnosis    Migraines    Vertigo   [2]   Outpatient Medications Marked as Taking for the 05/13/23 encounter (Office Visit) with Zetta Bills, FNP   Medication Sig Dispense Refill    aspirin-acetaminophen-caffeine (Excedrin Migraine) 250-250-65 MG per tablet       Cholecalciferol (VITAMIN D3 PO)       Cyanocobalamin (VITAMIN B 12 PO)       ST JOHNS WORT PO      [3]   Allergies  Allergen Reactions    Penicillins Hives and Itching   [4]   Past Medical History:  Diagnosis Date    GAD (generalized anxiety disorder)     MDD (major depressive disorder), recurrent episode     Mental disorder     Migraine     OCD (obsessive compulsive disorder)     Vertigo    [5]   Past Surgical History:  Procedure Laterality Date    WISDOM TOOTH EXTRACTION Bilateral 04/07/2022   [6]   Family History  Problem Relation Name Age of Onset    Cancer Paternal Grandfather Paternal Grandfather     Stroke Paternal Grandfather Paternal Grandfather     Kidney failure Paternal Grandmother Paternal  Grandmother     Diabetes Paternal Grandmother Paternal Grandmother     Stroke Maternal Grandmother Maternal Grandmother     Hypertension Father Father     Miscarriages / [place] Mother     [7]   Social History  Tobacco Use    Smoking status: Never    Smokeless tobacco: Never   Vaping Use    Vaping status: Never Used   Substance Use Topics    Alcohol use: Yes     Alcohol/week: 2.0 standard drinks of alcohol     Types: 2 Glasses of wine per week    Drug use: Never

## 2023-05-13 NOTE — Progress Notes (Signed)
Have you seen any specialists since your last visit with [place]?  No      The patient was informed that the following HM items are still outstanding:   Health Maintenance Due   Topic Date Due    HPV Series (1 - 3-dose series) Never done    INFLUENZA VACCINE  01/01/2023    COVID-19 Vaccine (3 - 2024-25 season) 02/01/2023

## 2023-05-14 LAB — SARS-COV-2 (COVID-19) RNA, PCR: SARS-CoV-2 (COVID-19) RNA: NOT DETECTED

## 2023-05-16 LAB — CULTURE, THROAT

## 2023-05-26 ENCOUNTER — Encounter (INDEPENDENT_AMBULATORY_CARE_PROVIDER_SITE_OTHER): Payer: Self-pay | Admitting: Family

## 2023-05-26 ENCOUNTER — Ambulatory Visit (INDEPENDENT_AMBULATORY_CARE_PROVIDER_SITE_OTHER): Payer: BC Managed Care – PPO | Admitting: Family

## 2023-05-26 VITALS — BP 122/87 | HR 79 | Temp 97.3°F | Ht 64.0 in | Wt 236.0 lb

## 2023-05-26 DIAGNOSIS — Z13228 Encounter for screening for other metabolic disorders: Secondary | ICD-10-CM

## 2023-05-26 DIAGNOSIS — E66813 Obesity, class 3: Secondary | ICD-10-CM

## 2023-05-26 DIAGNOSIS — Z13220 Encounter for screening for lipoid disorders: Secondary | ICD-10-CM

## 2023-05-26 DIAGNOSIS — G43009 Migraine without aura, not intractable, without status migrainosus: Secondary | ICD-10-CM

## 2023-05-26 DIAGNOSIS — Z1329 Encounter for screening for other suspected endocrine disorder: Secondary | ICD-10-CM

## 2023-05-26 DIAGNOSIS — Z0000 Encounter for general adult medical examination without abnormal findings: Secondary | ICD-10-CM

## 2023-05-26 DIAGNOSIS — M549 Dorsalgia, unspecified: Secondary | ICD-10-CM

## 2023-05-26 DIAGNOSIS — Z6841 Body Mass Index (BMI) 40.0 and over, adult: Secondary | ICD-10-CM

## 2023-05-26 DIAGNOSIS — Z130 Encounter for screening for diseases of the blood and blood-forming organs and certain disorders involving the immune mechanism: Secondary | ICD-10-CM

## 2023-05-26 DIAGNOSIS — Z1321 Encounter for screening for nutritional disorder: Secondary | ICD-10-CM

## 2023-05-26 LAB — LAB USE ONLY - CBC WITH DIFFERENTIAL
Absolute Basophils: 0.05 10*3/uL (ref 0.00–0.08)
Absolute Eosinophils: 0.3 10*3/uL (ref 0.00–0.44)
Absolute Immature Granulocytes: 0.02 10*3/uL (ref 0.00–0.07)
Absolute Lymphocytes: 1.8 10*3/uL (ref 0.42–3.22)
Absolute Monocytes: 0.53 10*3/uL (ref 0.21–0.85)
Absolute Neutrophils: 4.58 10*3/uL (ref 1.10–6.33)
Absolute nRBC: 0 10*3/uL (ref <=0.00)
Basophils %: 0.7 %
Eosinophils %: 4.1 %
Hematocrit: 42.6 % (ref 34.7–43.7)
Hemoglobin: 13.2 g/dL (ref 11.4–14.8)
Immature Granulocytes %: 0.3 %
Lymphocytes %: 24.7 %
MCH: 27 pg (ref 25.1–33.5)
MCHC: 31 g/dL — ABNORMAL LOW (ref 31.5–35.8)
MCV: 87.3 fL (ref 78.0–96.0)
MPV: 12.6 fL — ABNORMAL HIGH (ref 8.9–12.5)
Monocytes %: 7.3 %
Neutrophils %: 62.9 %
Platelet Count: 245 10*3/uL (ref 142–346)
Preliminary Absolute Neutrophil Count: 4.58 10*3/uL (ref 1.10–6.33)
RBC: 4.88 10*6/uL (ref 3.90–5.10)
RDW: 13 % (ref 11–15)
WBC: 7.28 10*3/uL (ref 3.10–9.50)
nRBC %: 0 /100{WBCs} (ref <=0.0)

## 2023-05-26 LAB — COMPREHENSIVE METABOLIC PANEL
ALT: 30 U/L (ref <=55)
AST (SGOT): 81 U/L — ABNORMAL HIGH (ref <=41)
Albumin/Globulin Ratio: 1.4 (ref 0.9–2.2)
Albumin: 4.3 g/dL (ref 3.5–5.0)
Alkaline Phosphatase: 77 U/L (ref 37–117)
Anion Gap: 11 (ref 5.0–15.0)
BUN: 9 mg/dL (ref 7–21)
Bilirubin, Total: 0.6 mg/dL (ref 0.2–1.2)
CO2: 23 meq/L (ref 17–29)
Calcium: 9.4 mg/dL (ref 8.5–10.5)
Chloride: 105 meq/L (ref 99–111)
Creatinine: 1 mg/dL (ref 0.4–1.0)
GFR: >60 mL/min/{1.73_m2} (ref >=60.0)
Globulin: 3.1 g/dL (ref 2.0–3.6)
Glucose: 81 mg/dL (ref 70–100)
Hemolysis Index: 6 {index}
Potassium: 4.3 meq/L (ref 3.5–5.3)
Protein, Total: 7.4 g/dL (ref 6.0–8.3)
Sodium: 139 meq/L (ref 135–145)

## 2023-05-26 LAB — HEMOGLOBIN A1C
Average Estimated Glucose: 105.4 mg/dL
Hemoglobin A1C: 5.3 % (ref 4.6–5.6)

## 2023-05-26 LAB — LIPID PANEL
Cholesterol / HDL Ratio: 4.1 {index}
Cholesterol: 181 mg/dL (ref <=199)
HDL: 44 mg/dL (ref >=40)
LDL Calculated: 112 mg/dL — ABNORMAL HIGH (ref 0–99)
Triglycerides: 127 mg/dL (ref 34–149)
VLDL Calculated: 25 mg/dL (ref 10–40)

## 2023-05-26 LAB — THYROID STIMULATING HORMONE (TSH) WITH REFLEX TO FREE T4: TSH: 1.45 u[IU]/mL (ref 0.35–4.94)

## 2023-05-26 NOTE — Progress Notes (Signed)
 Date: 05/26/2023 9:09 AM   Patient ID: Peggy Johnson is a 22 y.o. female.         Have you seen any specialists since your last visit with [place]?  No      The patient was informed that the following HM items are still outstanding:   Nothing     Subjective:

## 2023-10-22 ENCOUNTER — Encounter (INDEPENDENT_AMBULATORY_CARE_PROVIDER_SITE_OTHER): Payer: Self-pay | Admitting: Adult Health

## 2023-10-22 ENCOUNTER — Ambulatory Visit: Attending: Adult Health | Admitting: Adult Health

## 2023-10-22 ENCOUNTER — Ambulatory Visit (FREE_STANDING_LABORATORY_FACILITY): Admitting: Adult Health

## 2023-10-22 VITALS — BP 127/82 | HR 85 | Temp 97.2°F | Resp 14 | Wt 204.0 lb

## 2023-10-22 DIAGNOSIS — Z01419 Encounter for gynecological examination (general) (routine) without abnormal findings: Secondary | ICD-10-CM

## 2023-10-22 DIAGNOSIS — K602 Anal fissure, unspecified: Secondary | ICD-10-CM

## 2023-10-22 DIAGNOSIS — Z1151 Encounter for screening for human papillomavirus (HPV): Secondary | ICD-10-CM

## 2023-10-22 DIAGNOSIS — Z0184 Encounter for antibody response examination: Secondary | ICD-10-CM

## 2023-10-22 DIAGNOSIS — Z124 Encounter for screening for malignant neoplasm of cervix: Secondary | ICD-10-CM

## 2023-10-22 DIAGNOSIS — Z30431 Encounter for routine checking of intrauterine contraceptive device: Secondary | ICD-10-CM

## 2023-10-22 LAB — MUMPS ANTIBODY, IGG: Mumps Ab, IgG: 120 [AU]/ml (ref >=11.0)

## 2023-10-22 LAB — RUBEOLA ANTIBODY, IGG: Rubeola (Measles) Antibody IgG: 142 [AU]/ml (ref >=16.5)

## 2023-10-22 LAB — VARICELLA ZOSTER VIRUS (VZV) ANTIBODY, IGG: Varicella Zoster Virus Antibody, IgG: 1.23 {s_co_ratio} (ref >=1.000)

## 2023-10-22 LAB — HEPATITIS B SURFACE (HBV) ANTIBODY, QUANTITATIVE: Hepatitis B Surface Antibody: 397.3 m[IU]/mL

## 2023-10-22 LAB — RUBELLA ANTIBODY, IGG: Rubella Antibody IgG: 8.67 {index} (ref >=1.00)

## 2023-10-22 NOTE — Progress Notes (Signed)
 [name] is a 23 y.o. year old female, G0P0000 established patient, and she is here for a well woman exam.   Patient's last menstrual period was 10/21/2023 (exact date)..  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   Respiratory: Negative for shortness of breath.    Cardiovascular: Negative for chest pain.   Gastrointestinal: Negative for nausea, vomiting and abdominal pain. Occasionally bleeding when she wipes, worried she has a hemorrhoid   Genitourinary: Negative for dysuria and pelvic pain.   All other systems reviewed and are negative.    OBJECTIVE:    Vitals: Blood pressure 127/82, pulse 85, temperature 97.2 F (36.2 C), temperature source Temporal, resp. rate 14, weight 204 lb (92.5 kg), last menstrual period 10/21/2023.Body mass index is 35.02 kg/m.      Exams:     GENERAL: well developed, well nourished, in no apparent distress  NECK: Neck is supple with full range of motion; thyroid  is normal to palpation;   RESPIRATORY: normal respiratory rate and pattern with no distress  BREASTS: symmetric; no overlying skin changes;  no tenderness, nodularity, palpable nodes,or masses;   GASTROINTESTINAL: no masses palpated; nontender;   Pelvic exam:  VULVA:  normal appearing vulva with no masses, tenderness or lesions, normal clitoris  BLADDER: non tender bladder and urethra. No masses noted. Ureteral meatus appears normal without masses or tenderness  VAGINA: normal appearing vagina with scant blood noted   CERVIX: normal appearing cervix without discharge or lesions. IUD strings seen   UTERUS: uterus is normal size, shape, consistency and nontender, anteverted  ADNEXA:  nontender and no masses.  RECTAL: small perianal fissure at ~ 11 oclock   MUSCULOSKELETAL: normal gait;   NEUROLOGIC: Alert and oriented   PSYCHIATRIC: appropriate affect and demeanor;       ASSESSMENT:   1. Well woman exam with routine gynecological exam        2. Encounter for Papanicolaou smear for cervical cancer screening  Gyn Cytology/Pap Smear (Order)    Gyn Cytology/Pap Smear (Order)      3. Encounter for screening for human papillomavirus (HPV)  Gyn Cytology/Pap Smear (Order)    Gyn Cytology/Pap Smear (Order)      4. Immunity status testing  Rubeola Antibody, IgG    Mumps Antibody, IgG    Rubella Antibody, IgG    Varicella Zoster Virus (VZV) Antibody, IgG    Quantiferon(R) - TB Gold Plus    Hepatitis B Surface (HBV) Antibody, Quantitative    Rubeola  Antibody, IgG    Mumps Antibody, IgG    Rubella Antibody, IgG    Varicella Zoster Virus (VZV) Antibody, IgG    Quantiferon(R) - TB Gold Plus    Hepatitis B Surface (HBV) Antibody, Quantitative      5. Perianal fissure        6. Encounter for routine checking of intrauterine contraceptive device (IUD)            Routine well woman gynecological examination:  Normal exam.     PLAN:       Pap collected , if normal, next due in 3 years per ASCCP guidelines. Pap guidelines discussed with patient.   RECOMMENDATIONS given include: taught and demonstrated SBE. Reviewed nutrition and exercise.  IUD in place, removal due 2031  Labs drawn for medical school, forms signed   Declines STD testing   Advised to increase fluids and fibers to reduce constipation.  Follow with GI if pain/bleeding with stool continues     Altie Savard A Terril Chestnut, NP      Orders Placed This Encounter   Procedures    Rubeola Antibody, IgG     Standing Status:   Future     Number of Occurrences:   1     Expected Date:   10/22/2023     Expiration Date:   10/21/2024     Release to patient:   Immediate    Mumps Antibody, IgG     Standing Status:   Future     Number of Occurrences:   1     Expected Date:   10/22/2023     Expiration Date:   10/21/2024     Release to patient:   Immediate    Rubella Antibody, IgG     Standing Status:   Future     Number of Occurrences:   1     Expected Date:   10/22/2023     Expiration Date:   11/22/2023     Release to patient:   Immediate    Varicella Zoster Virus (VZV) Antibody, IgG     Standing Status:   Future     Number of Occurrences:   1     Expected Date:   10/22/2023     Expiration Date:   11/22/2023     Release to patient:   Immediate    Quantiferon(R) - TB Gold Plus     Standing Status:   Future     Number of Occurrences:   1     Expected Date:   10/22/2023     Expiration Date:   10/21/2024     Release to patient:   Immediate    Hepatitis B Surface (HBV) Antibody, Quantitative     Standing Status:   Future     Number of Occurrences:   1     Expected Date:   10/22/2023     Expiration Date:   10/21/2024     Release to patient:   Immediate      Results through [facility].          [1]   Past Medical History:  Diagnosis Date    GAD (generalized anxiety disorder)     MDD (major depressive disorder), recurrent episode     Mental disorder     Migraine     OCD (obsessive compulsive disorder)     Vertigo    [2]   Past Surgical History:  Procedure Laterality Date    WISDOM TOOTH EXTRACTION Bilateral  04/07/2022   [3]   Family History  Problem Relation Name Age of Onset    Cancer Paternal Grandfather Paternal Grandfather     Stroke Paternal Grandfather Paternal Grandfather     Kidney failure Paternal Grandmother Paternal Grandmother     Diabetes Paternal Grandmother Paternal Grandmother      Stroke Maternal Grandmother Maternal Grandmother     Hypertension Father Father     Miscarriages / [place] Mother     [4]   Social History  Socioeconomic History    Marital status: Single   Tobacco Use    Smoking status: Never    Smokeless tobacco: Never   Vaping Use    Vaping status: Never Used   Substance and Sexual Activity    Alcohol use: Yes     Alcohol/week: 2.0 standard drinks of alcohol     Types: 2 Glasses of wine per week    Drug use: Never    Sexual activity: Not Currently     Birth control/protection: Condom, OCP     Social Drivers of Health     Financial Resource Strain: Low Risk  (01/25/2023)    Overall Financial Resource Strain (CARDIA)     Difficulty of Paying Living Expenses: Not hard at all   Food Insecurity: No Food Insecurity (01/25/2023)    Hunger Vital Sign     Worried About Running Out of Food in the Last Year: Never true     Ran Out of Food in the Last Year: Never true   Transportation Needs: No Transportation Needs (01/25/2023)    PRAPARE - Therapist, art (Medical): No     Lack of Transportation (Non-Medical): No   Physical Activity: Sufficiently Active (01/25/2023)    Exercise Vital Sign     Days of Exercise per Week: 5 days     Minutes of Exercise per Session: 60 min   Stress: No Stress Concern Present (10/22/2023)    [employer] of Occupational Health - Occupational Stress Questionnaire     Feeling of Stress : Not at all   Social Connections: Moderately Integrated (01/25/2023)    Social Connection and Isolation Panel [NHANES]     Frequency of Communication with Friends and Family: More than three times a week     Frequency of Social Gatherings with Friends and Family: More than three times a week     Attends Religious Services: 1 to 4 times per year     Active Member of [employer] or Organizations: Yes     Attends Banker Meetings: 1 to 4 times per year     Marital Status: Never married   Intimate Partner Violence: Not At Risk (01/25/2023)     Humiliation, Afraid, Rape, and Kick questionnaire     Fear of Current or Ex-Partner: No     Emotionally Abused: No     Physically Abused: No     Sexually Abused: No   Housing Stability: Low Risk  (01/25/2023)    Housing Stability Vital Sign     Unable to Pay for Housing in the Last Year: No     Number of Times Moved in the Last Year: 0     Homeless in the Last Year: No   [5]   Current Outpatient Medications on File Prior to Visit   Medication Sig Dispense Refill    aspirin-acetaminophen-caffeine (Excedrin Migraine) 250-250-65 MG per tablet  (Patient taking differently: As needed)      Cholecalciferol (VITAMIN D3  PO)       Cyanocobalamin (VITAMIN B 12 PO)       ST JOHNS WORT PO  (Patient taking differently: Some times)       No current facility-administered medications on file prior to visit.   [6]   Allergies  Allergen Reactions    Penicillins Hives and Itching

## 2023-10-24 LAB — QUANTIFERON(R) - TB GOLD PLUS
Mitogen-NIL: >10 [IU]/mL
NIL: 0.01 [IU]/mL
Quantiferon TB Gold Plus: NEGATIVE
TB1-NIL: 0 [IU]/mL
TB2-NIL: 0 [IU]/mL

## 2023-10-27 ENCOUNTER — Ambulatory Visit (INDEPENDENT_AMBULATORY_CARE_PROVIDER_SITE_OTHER): Payer: Self-pay | Admitting: Adult Health

## 2023-10-29 LAB — LAB USE ONLY- GYN CYTOLOGY/PAP SMEAR

## 2024-03-30 ENCOUNTER — Other Ambulatory Visit (INDEPENDENT_AMBULATORY_CARE_PROVIDER_SITE_OTHER): Payer: Self-pay | Admitting: Family
